# Patient Record
Sex: Male | Born: 1980 | Hispanic: No | Marital: Single | Smoking: Current every day smoker
Health system: Southern US, Community
[De-identification: ages and names within clinical notes are randomized; demographics above are authoritative.]

---

## 2008-02-22 ENCOUNTER — Emergency Department (HOSPITAL_COMMUNITY): Admission: EM | Admit: 2008-02-22 | Discharge: 2008-02-23 | Payer: Self-pay | Admitting: Emergency Medicine

## 2008-11-01 ENCOUNTER — Emergency Department (HOSPITAL_COMMUNITY): Admission: EM | Admit: 2008-11-01 | Discharge: 2008-11-02 | Payer: Self-pay | Admitting: Emergency Medicine

## 2008-11-01 ENCOUNTER — Emergency Department (HOSPITAL_COMMUNITY): Admission: EM | Admit: 2008-11-01 | Discharge: 2008-11-01 | Payer: Self-pay | Admitting: Emergency Medicine

## 2010-07-30 IMAGING — CT CT HEAD W/O CM
1 of 2 series · 13 of 30 positions shown, 17 images · non-contrast
Comparison: None available.

CLINICAL DATA: Numbness and headache.  Weakness all over.

CT HEAD WITHOUT CONTRAST
TECHNIQUE: Contiguous axial images were obtained from the base of
the skull through the vertex without contrast.

[Series 2: brain · axial · 0.49mm/px · z∈[+135,+267]mm · 13 of 32 slices shown, 17 images]
[im 3/32  brain]
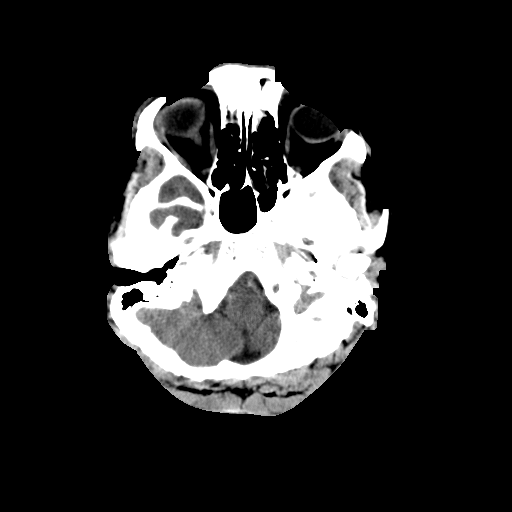
[im 3/32  bone]
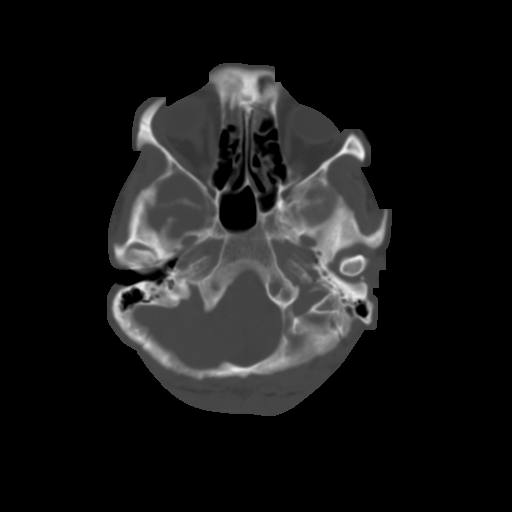
[im 5/32  brain]
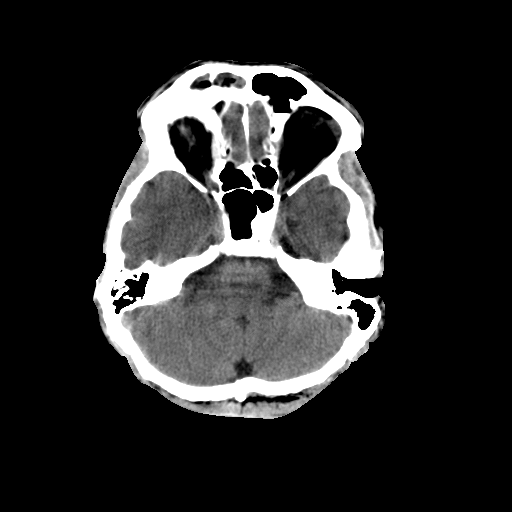
[im 7/32  brain]
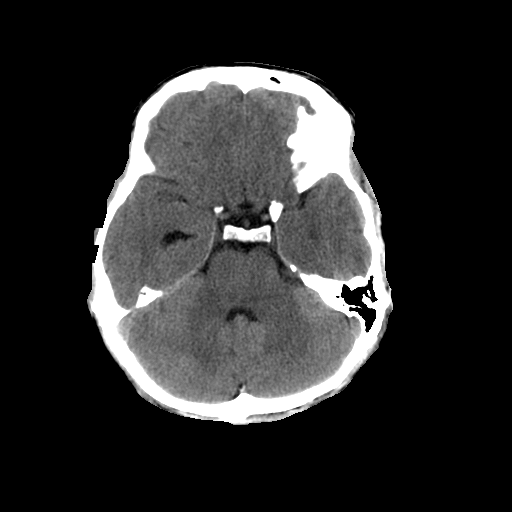
[im 9/32  brain]
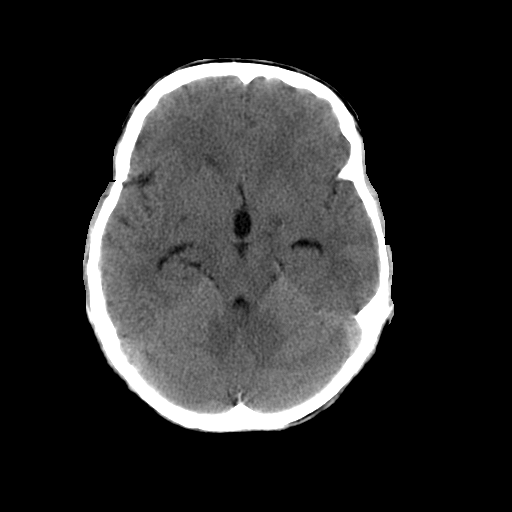
[im 12/32  brain]
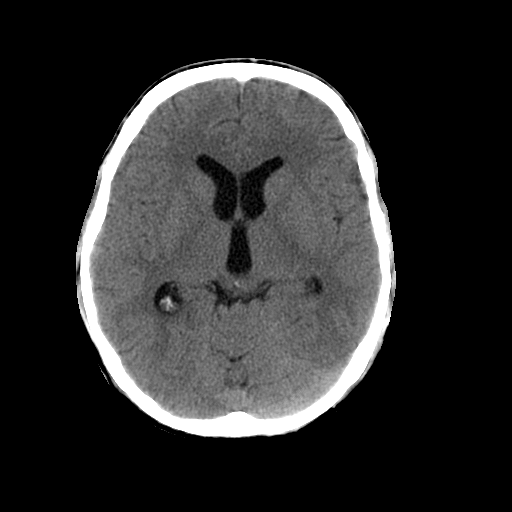
[im 12/32  bone]
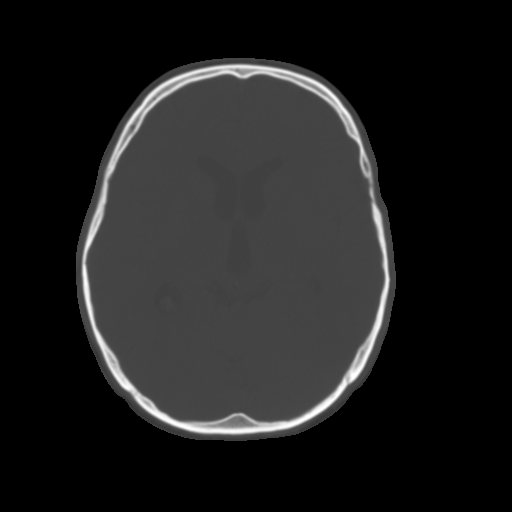
[im 14/32  brain]
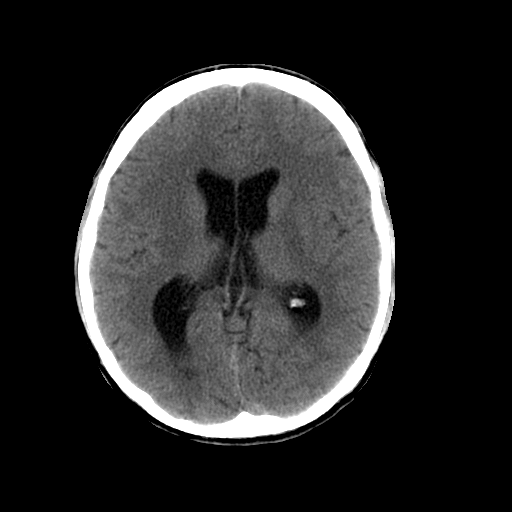
[im 16/32  brain]
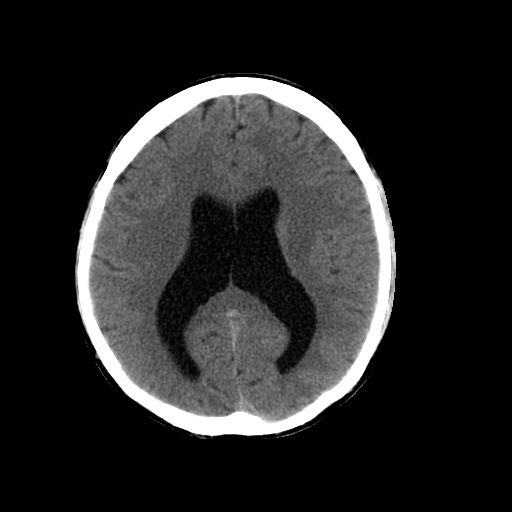
[im 18/32  brain]
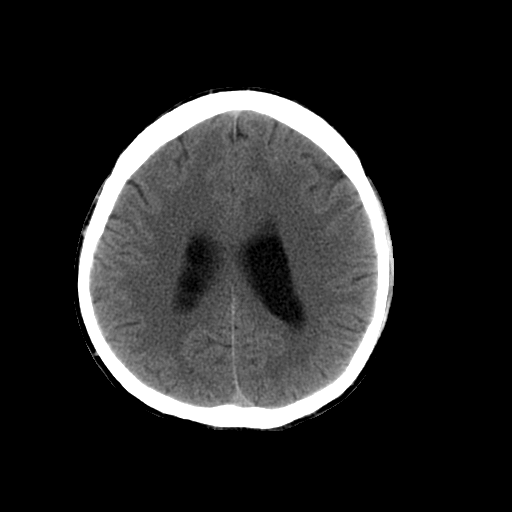
[im 20/32  brain]
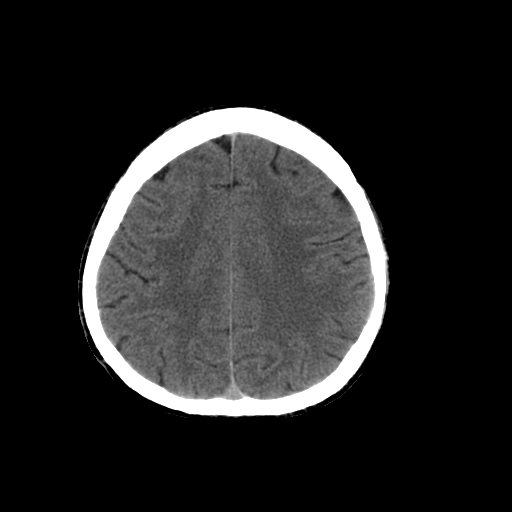
[im 20/32  bone]
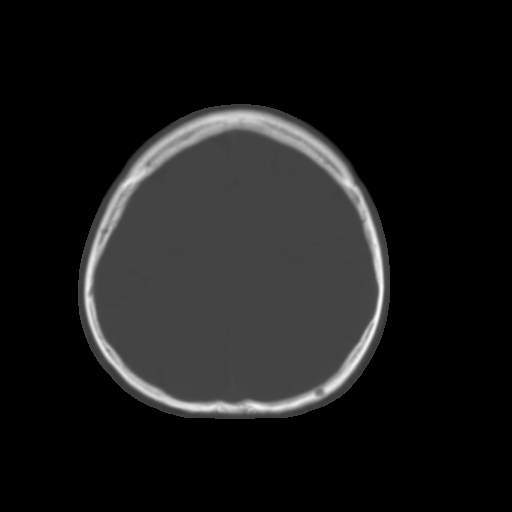
[im 23/32  brain]
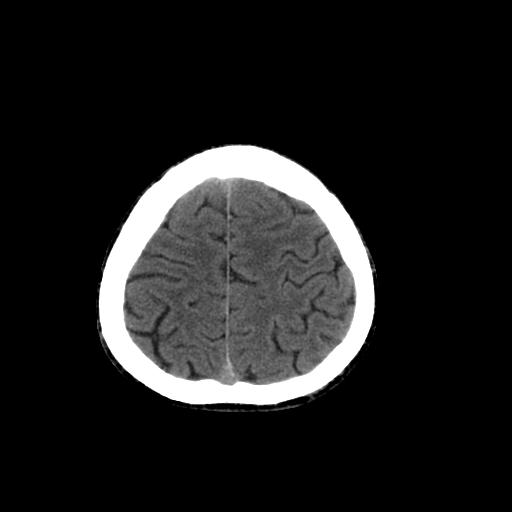
[im 25/32  brain]
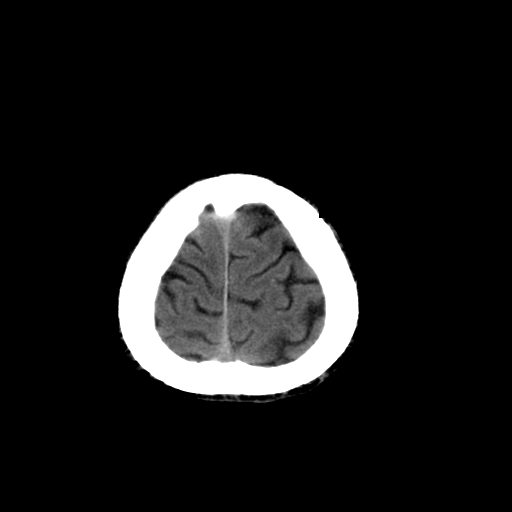
[im 27/32  brain]
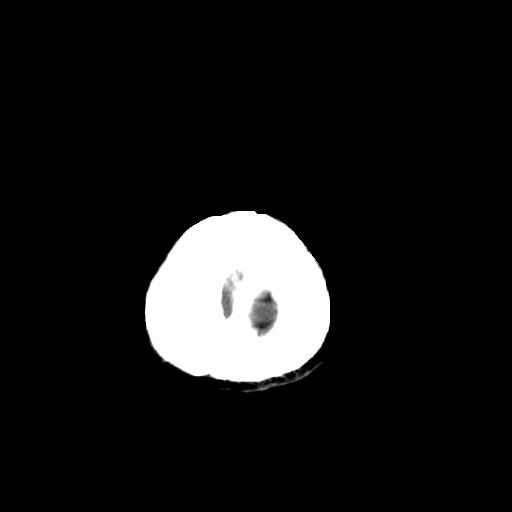
[im 29/32  brain]
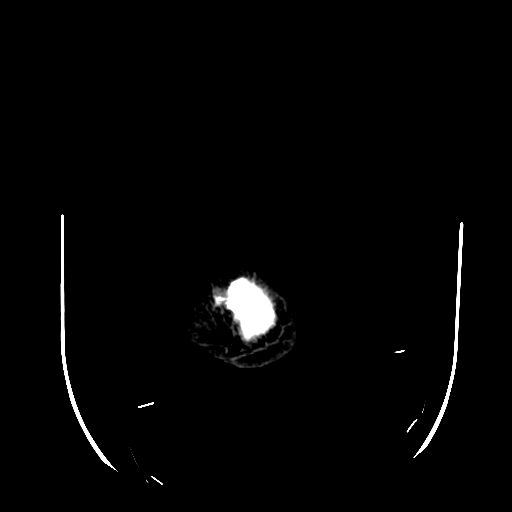
[im 29/32  bone]
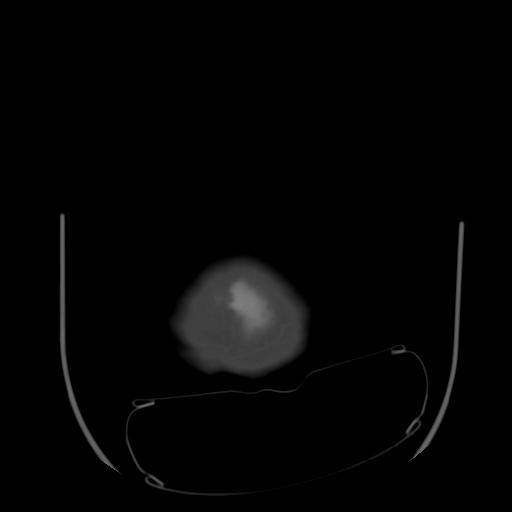

[13 of 30 positions shown; findings below may reference images not displayed]

FINDINGS: There is prominence of the lateral ventricles and third
ventricle.  The fourth ventricle appears within normal limits.
Cerebral sulci appear within normal limits.  There is no mass
lesion, mass effect, midline shift, or hemorrhage. There is mucosal
thickening and near-complete opacification of the right frontal
sinus.  Correlate clinically for a acute sinusitis.  Mild
opacification of other paranasal sinuses.  Mastoid air cells appear
clear.
IMPRESSION: 1.  Near complete opacification of the right frontal sinus.
Correlate for sinusitis.
2.  Prominence of the lateral ventricles and third ventricle
suggesting aqueductal stenosis.

## 2013-11-24 ENCOUNTER — Encounter (HOSPITAL_COMMUNITY): Payer: Self-pay | Admitting: Emergency Medicine

## 2013-11-24 ENCOUNTER — Emergency Department (HOSPITAL_COMMUNITY)
Admission: EM | Admit: 2013-11-24 | Discharge: 2013-11-24 | Disposition: A | Discharge status: Home / Self Care | Payer: Self-pay | Attending: Emergency Medicine | Admitting: Emergency Medicine

## 2013-11-24 DIAGNOSIS — W219XXA Striking against or struck by unspecified sports equipment, initial encounter: Secondary | ICD-10-CM | POA: Insufficient documentation

## 2013-11-24 DIAGNOSIS — F17200 Nicotine dependence, unspecified, uncomplicated: Secondary | ICD-10-CM | POA: Insufficient documentation

## 2013-11-24 DIAGNOSIS — Y9239 Other specified sports and athletic area as the place of occurrence of the external cause: Secondary | ICD-10-CM | POA: Insufficient documentation

## 2013-11-24 DIAGNOSIS — Y9366 Activity, soccer: Secondary | ICD-10-CM | POA: Insufficient documentation

## 2013-11-24 DIAGNOSIS — S01501A Unspecified open wound of lip, initial encounter: Secondary | ICD-10-CM | POA: Insufficient documentation

## 2013-11-24 DIAGNOSIS — Y92838 Other recreation area as the place of occurrence of the external cause: Secondary | ICD-10-CM

## 2013-11-24 DIAGNOSIS — S01511A Laceration without foreign body of lip, initial encounter: Secondary | ICD-10-CM

## 2013-11-24 MED ORDER — PENICILLIN V POTASSIUM 250 MG PO TABS: 250.0000 mg | ORAL_TABLET | Freq: Four times a day (QID) | ORAL | Status: AC

## 2013-11-24 MED ORDER — BENZOCAINE 10 % MT GEL: 1.0000 "application " | OROMUCOSAL | Status: AC | PRN

## 2013-11-24 NOTE — ED Provider Notes (Signed)
CSN: [id number]     Arrival date & time 11/24/13  1301 History  This chart was scribed for non-physician practitioner Roxy Horseman, PA-C working with Vida Roller, MD by Leone Payor, ED Scribe. This patient was seen in room TR06C/TR06C and the patient's care was started at 3:36 PM.    Chief Complaint  Patient presents with  . Lip Laceration   The history is provided by the patient. No language interpreter was used.    HPI Comments: Henry Maynard is a 33 y.o. male who presents to the Emergency Department complaining of a left upper inner lip laceration that occurred last night. Patient states he was struck to the face by someone else's shoulder which occurred while playing soccer. He states the bleeding is controlled currently.  Denies LOC. He denies any other injuries at this time.   History reviewed. No pertinent past medical history. History reviewed. No pertinent past surgical history. History reviewed. No pertinent family history. History  Substance Use Topics  . Smoking status: Current Every Day Smoker  . Smokeless tobacco: Not on file  . Alcohol Use: Yes    Review of Systems  Constitutional: Negative for fever and chills.  Respiratory: Negative for shortness of breath.   Cardiovascular: Negative for chest pain.  Gastrointestinal: Negative for nausea, vomiting, diarrhea and constipation.  Genitourinary: Negative for dysuria.  Skin: Positive for wound (laceration).  Neurological: Negative for headaches.      Allergies  Review of patient's allergies indicates no known allergies.  Home Medications   Prior to Admission medications   Not on File   BP 117/71  Pulse 92  Temp(Src) 98.7 F (37.1 C) (Oral)  Resp 15  SpO2 99% Physical Exam  Nursing note and vitals reviewed. Constitutional: He is oriented to person, place, and time. He appears well-developed and well-nourished.  HENT:  Head: Normocephalic and atraumatic.  Upper interior lip laceration, not through  and through. No dental trauma. No evidence of abscess.   Cardiovascular: Normal rate.   Pulmonary/Chest: Effort normal.  Abdominal: He exhibits no distension.  Neurological: He is alert and oriented to person, place, and time.  Skin: Skin is warm and dry.  Psychiatric: He has a normal mood and affect.    ED Course  Procedures (including critical care time)  DIAGNOSTIC STUDIES: Oxygen Saturation is 99% on RA, normal by my interpretation.    COORDINATION OF CARE: 3:39 PM Discussed treatment plan with pt at bedside and pt agreed to plan.   Labs Review Labs Reviewed - No data to display  Imaging Review No results found.   EKG Interpretation None      MDM   Final diagnoses:  Lip laceration, initial encounter    Patient with uncomplicated interior upper lip laceration.  The laceration is ~20 hours old.  No indication for repair.  Will heal by secondary wound healing.  No evidence of infection.  Recommend PCP follow-up.  Not a through and through injury, no outer lip involvement.  I personally performed the services described in this documentation, which was scribed in my presence. The recorded information has been reviewed and is accurate.    Roxy Horseman, PA-C 11/24/13 [id number]

## 2013-11-24 NOTE — Discharge Instructions (Signed)
Facial Laceration  A facial laceration is a cut on the face. These injuries can be painful and cause bleeding. Lacerations usually heal quickly, but they need special care to reduce scarring. DIAGNOSIS  Your health care provider will take a medical history, ask for details about how the injury occurred, and examine the wound to determine how deep the cut is. TREATMENT  Some facial lacerations may not require closure. Others may not be able to be closed because of an increased risk of infection. The risk of infection and the chance for successful closure will depend on various factors, including the amount of time since the injury occurred. The wound may be cleaned to help prevent infection. If closure is appropriate, pain medicines may be given if needed. Your health care provider will use stitches (sutures), wound glue (adhesive), or skin adhesive strips to repair the laceration. These tools bring the skin edges together to allow for faster healing and a better cosmetic outcome. If needed, you may also be given a tetanus shot. HOME CARE INSTRUCTIONS  Only take over-the-counter or prescription medicines as directed by your health care provider.  Follow your health care provider's instructions for wound care. These instructions will vary depending on the technique used for closing the wound. For Sutures:  Keep the wound clean and dry.   If you were given a bandage (dressing), you should change it at least once a day. Also change the dressing if it becomes wet or dirty, or as directed by your health care provider.   Wash the wound with soap and water 2 times a day. Rinse the wound off with water to remove all soap. Pat the wound dry with a clean towel.   After cleaning, apply a thin layer of the antibiotic ointment recommended by your health care provider. This will help prevent infection and keep the dressing from sticking.   You may shower as usual after the first 24 hours. Do not soak the  wound in water until the sutures are removed.   Get your sutures removed as directed by your health care provider. With facial lacerations, sutures should usually be taken out after 4-5 days to avoid stitch marks.   Wait a few days after your sutures are removed before applying any makeup. For Skin Adhesive Strips:  Keep the wound clean and dry.   Do not get the skin adhesive strips wet. You may bathe carefully, using caution to keep the wound dry.   If the wound gets wet, pat it dry with a clean towel.   Skin adhesive strips will fall off on their own. You may trim the strips as the wound heals. Do not remove skin adhesive strips that are still stuck to the wound. They will fall off in time.  For Wound Adhesive:  You may briefly wet your wound in the shower or bath. Do not soak or scrub the wound. Do not swim. Avoid periods of heavy sweating until the skin adhesive has fallen off on its own. After showering or bathing, gently pat the wound dry with a clean towel.   Do not apply liquid medicine, cream medicine, ointment medicine, or makeup to your wound while the skin adhesive is in place. This may loosen the film before your wound is healed.   If a dressing is placed over the wound, be careful not to apply tape directly over the skin adhesive. This may cause the adhesive to be pulled off before the wound is healed.   Avoid   prolonged exposure to sunlight or tanning lamps while the skin adhesive is in place.  The skin adhesive will usually remain in place for 5-10 days, then naturally fall off the skin. Do not pick at the adhesive film.  After Healing: Once the wound has healed, cover the wound with sunscreen during the day for 1 full year. This can help minimize scarring. Exposure to ultraviolet light in the first year will darken the scar. It can take 1-2 years for the scar to lose its redness and to heal completely.  SEEK IMMEDIATE MEDICAL CARE IF:  You have redness, pain, or  swelling around the wound.   You see ayellowish-white fluid (pus) coming from the wound.   You have chills or a fever.  MAKE SURE YOU:  Understand these instructions.  Will watch your condition.  Will get help right away if you are not doing well or get worse. Document Released: 04/13/2004 Document Revised: 12/25/2012 Document Reviewed: 10/17/2012 [employer] Patient Information 2015 [employer]. This information is not intended to replace advice given to you by your health care provider. Make sure you discuss any questions you have with your health care provider.  

## 2013-11-24 NOTE — ED Notes (Signed)
Pt reports that he was playing soccer yesterday and got hit in the face with a shoulder. Pt with a small laceration to the upper lip on the left side.

## 2013-11-26 NOTE — ED Provider Notes (Signed)
Medical screening examination/treatment/procedure(s) were performed by non-physician practitioner and as supervising physician I was immediately available for consultation/collaboration.    Dinorah Masullo D Cormac Wint, MD 11/26/13 [id number]
